# Patient Record
Sex: Female | Born: 1990 | Race: White | Hispanic: No | Marital: Single | Smoking: Never smoker
Health system: Southern US, Community
[De-identification: ages and names within clinical notes are randomized; demographics above are authoritative.]

## PROBLEM LIST (undated history)

## (undated) DIAGNOSIS — R5383 Other fatigue: Secondary | ICD-10-CM

## (undated) DIAGNOSIS — R5381 Other malaise: Secondary | ICD-10-CM

## (undated) DIAGNOSIS — R000 Tachycardia, unspecified: Secondary | ICD-10-CM

## (undated) DIAGNOSIS — T7840XA Allergy, unspecified, initial encounter: Secondary | ICD-10-CM

## (undated) DIAGNOSIS — K219 Gastro-esophageal reflux disease without esophagitis: Secondary | ICD-10-CM

## (undated) DIAGNOSIS — U071 COVID-19: Secondary | ICD-10-CM

## (undated) HISTORY — DX: Gastro-esophageal reflux disease without esophagitis: K21.9

## (undated) HISTORY — DX: Other fatigue: R53.83

## (undated) HISTORY — DX: Allergy, unspecified, initial encounter: T78.40XA

## (undated) HISTORY — DX: COVID-19: U07.1

## (undated) HISTORY — DX: Other malaise: R53.81

## (undated) HISTORY — DX: Tachycardia, unspecified: R00.0

---

## 2013-02-10 ENCOUNTER — Ambulatory Visit: Payer: BC Managed Care – PPO | Admitting: Family Medicine

## 2013-02-10 ENCOUNTER — Encounter: Payer: Self-pay | Admitting: Family Medicine

## 2013-02-10 VITALS — BP 98/68 | HR 74 | Temp 98.1°F | Resp 16 | Ht 62.0 in | Wt 102.0 lb

## 2013-02-10 DIAGNOSIS — G8929 Other chronic pain: Secondary | ICD-10-CM

## 2013-02-10 DIAGNOSIS — R1013 Epigastric pain: Secondary | ICD-10-CM

## 2013-02-10 LAB — POCT CBC
Granulocyte percent: 79.7 %G (ref 37–80)
HCT, POC: 43.3 % (ref 37.7–47.9)
Hemoglobin: 13.9 g/dL (ref 12.2–16.2)
Lymph, poc: 1.5 (ref 0.6–3.4)
MCH, POC: 30.1 pg (ref 27–31.2)
MCHC: 32.1 g/dL (ref 31.8–35.4)
MCV: 93.8 fL (ref 80–97)
MID (cbc): 0.4 (ref 0–0.9)
MPV: 10.3 fL (ref 0–99.8)
POC Granulocyte: 7.6 — AB (ref 2–6.9)
POC LYMPH PERCENT: 15.8 %L (ref 10–50)
POC MID %: 4.5 %M (ref 0–12)
Platelet Count, POC: 197 10*3/uL (ref 142–424)
RBC: 4.62 M/uL (ref 4.04–5.48)
RDW, POC: 13 %
WBC: 9.5 10*3/uL (ref 4.6–10.2)

## 2013-02-10 MED ORDER — LANSOPRAZOLE 15 MG PO TBDP: 15.0000 mg | ORAL_TABLET | Freq: Every day | ORAL | Status: DC

## 2013-02-10 NOTE — Progress Notes (Signed)
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  She has developed epigastric pain over the past two weeks, getting more and more constant, and associated with nausea and self-induced vomiting.  She initially had one episode of passing out.   Going to [employer].  She is not eating well.  Her mother cooks for her, but she doesn't eat the fruits and veggies, only Nutella.  LMP:  End of last month  Objective:  NAD, thin woman Chest:  Clear Heart:  Reg, no murmur Abdomen: soft, minimally tender epigastric, no HSM Results for orders placed in visit on 02/10/13  POCT CBC      Result Value Range   WBC 9.5  4.6 - 10.2 K/uL   Lymph, poc 1.5  0.6 - 3.4   POC LYMPH PERCENT 15.8  10 - 50 %L   MID (cbc) 0.4  0 - 0.9   POC MID % 4.5  0 - 12 %M   POC Granulocyte 7.6 (*) 2 - 6.9   Granulocyte percent 79.7  37 - 80 %G   RBC 4.62  4.04 - 5.48 M/uL   Hemoglobin 13.9  12.2 - 16.2 g/dL   HCT, POC 16.1  09.6 - 47.9 %   MCV 93.8  80 - 97 fL   MCH, POC 30.1  27 - 31.2 pg   MCHC 32.1  31.8 - 35.4 g/dL   RDW, POC 04.5     Platelet Count, POC 197  142 - 424 K/uL   MPV 10.3  0 - 99.8 fL     Assessment:  Gastritis, poor diet  Plan:  I emphasized the need for eating vegetables and fruits, 5 portions per day. Mother, who is present, promises to cook for her and patient promises to be her mom for food.

## 2013-02-10 NOTE — Patient Instructions (Addendum)

## 2013-02-12 ENCOUNTER — Ambulatory Visit: Payer: BC Managed Care – PPO | Admitting: Family Medicine

## 2013-02-12 VITALS — BP 98/70 | HR 106 | Temp 98.2°F | Resp 16 | Ht 61.5 in | Wt 102.0 lb

## 2013-02-12 DIAGNOSIS — F411 Generalized anxiety disorder: Secondary | ICD-10-CM

## 2013-02-12 NOTE — Progress Notes (Signed)
22 yo with recent epigastric discomfort, which has now resolved.  Nevertheless, she developed paresthesias in hands, forearms, then left side.  Today, developed some chest tightness.  Heart palpitations present also.  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 She's the left about her own worries and perfectionism.  Plan: I asked patient to begin exercise program, eat a regular diet as her mother fixes her.  No diagnosis found.  Signed, Elvina Sidle, MD

## 2013-02-14 LAB — HELICOBACTER PYLORI  ANTIBODY, IGM: Helicobacter pylori, IgM: 6.4 U/mL (ref <9.0)

## 2013-04-28 ENCOUNTER — Other Ambulatory Visit: Payer: Self-pay | Admitting: Family Medicine

## 2014-01-17 ENCOUNTER — Ambulatory Visit (INDEPENDENT_AMBULATORY_CARE_PROVIDER_SITE_OTHER): Payer: BC Managed Care – PPO | Admitting: Physician Assistant

## 2014-01-17 VITALS — BP 98/62 | HR 79 | Temp 98.3°F | Resp 18 | Ht 62.0 in | Wt 101.0 lb

## 2014-01-17 DIAGNOSIS — Z111 Encounter for screening for respiratory tuberculosis: Secondary | ICD-10-CM

## 2014-01-17 NOTE — Progress Notes (Signed)
  Tuberculosis Risk Questionnaire  1. Yes  Were you born outside the [place] in one of the following parts of the world: [place], [place], [place], [place] or Eastern [place]?    2. Yes  Have you traveled outside the [place] and lived for more than one month in one of the following parts of the world: [place], [place], [place], [place] or [place]?    3. No Do you have a compromised immune system such as from any of the following conditions:HIV/AIDS, organ or bone marrow transplantation, diabetes, immunosuppressive medicines (e.g. Prednisone, Remicaide), leukemia, lymphoma, cancer of the head or neck, gastrectomy or jejunal bypass, end-stage renal disease (on dialysis), or silicosis?     4. Yes  Have you ever or do you plan on working in: a residential care center, a health care facility, a jail or prison or homeless shelter?    5. No Have you ever: injected illegal drugs, used crack cocaine, lived in a homeless shelter  or been in jail or prison?     6. No Have you ever been exposed to anyone with infectious tuberculosis?    Tuberculosis Symptom Questionnaire  Do you currently have any of the following symptoms?  1. No Unexplained cough lasting more than 3 weeks?   2. No Unexplained fever lasting more than 3 weeks.   3. No Night Sweats (sweating that leaves the bedclothes and sheets wet)     4. No Shortness of Breath   5. No Chest Pain   6. No Unintentional weight loss    7. No Unexplained fatigue (very tired for no reason)   

## 2014-01-17 NOTE — Patient Instructions (Signed)
Return in 48-72 hours to have your TB test read (between 2:30pm and 2:30pm Saturday)  You can have your second step placed any time between May 3 - May 17)   Tuberculin Skin Test The PPD skin test is a method used to help with the diagnosis of a disease called tuberculosis (TB). HOW THE TEST IS DONE  The test site (usually the forearm) is cleansed. The PPD extract is then injected under the top layer of skin, causing a blister to form on the skin. The reaction will take 48 - 72 hours to develop. You must return to your health care provider within that time to have the area checked. This will determine whether you have had a significant reaction to the PPD test. A reaction is measured in millimeters of hard swelling (induration) at the site. PREPARATION FOR TEST  There is no special preparation for this test. People with a skin rash or other skin irritations on their arms may need to have the test performed at a different spot on the body. Tell your health care provider if you have ever had a positive PPD skin test. If so, you should not have a repeat PPD test. Tell your doctor if you have a medical condition or if you take certain drugs, such as steroids, that can affect your immune system. These situations may lead to inaccurate test results. NORMAL FINDINGS A negative reaction (no induration) or a level of hard swelling that falls below a certain cutoff may mean that a person has not been infected with the bacteria that cause TB. There are different cutoffs for children, people with HIV, and other risk groups. Unfortunately, this is not a perfect test, and up to 20% of people infected with tuberculosis may not have a reaction on the PPD skin test. In addition, certain conditions that affect the immune system (cancer, recent chemotherapy, late-stage AIDS) may cause a false-negative test result.  The reaction will take 48 - 72 hours to develop. You must return to your health care provider within  that time to have the area checked. Follow your caregiver's instructions as to where and when to report for this to be done. Ranges for normal findings may vary among different laboratories and hospitals. You should always check with your doctor after having lab work or other tests done to discuss the meaning of your test results and whether your values are considered within normal limits. WHAT ABNORMAL RESULTS MEAN  The results of the test depend on the size of the skin reaction and on the person being tested.  A small reaction (5 mm of hard swelling at the site) is considered to be positive in people who have HIV, who are taking steroid therapy, or who have been in close contact with a person who has active tuberculosis. Larger reactions (greater than or equal to 10 mm) are considered positive in people with diabetes or kidney failure, and in health care workers, among others. In people with no known risks for tuberculosis, a positive reaction requires 15 mm or more of hard swelling at the site. RISKS AND COMPLICATIONS There is a very small risk of severe redness and swelling of the arm in people who have had a previous positive PPD test and who have the test again. There also have been a few rare cases of this reaction in people who have not been tested before. CONSIDERATIONS  A positive skin test does not necessarily mean that a person has active tuberculosis. More tests  will be done to check whether active disease is present. Many people who were born outside the [place] may have had a vaccine called "BCG," which can lead to a false-positive test result. MEANING OF TEST  Your caregiver will go over the test results with you and discuss the importance and meaning of your results, as well as treatment options and the need for additional tests if necessary. OBTAINING THE TEST RESULTS It is your responsibility to obtain your test results. Ask the lab or department performing the test when and how  you will get your results. Document Released: 05/20/2005 Document Revised: 11/02/2011 Document Reviewed: 07/22/2008 [facility] Patient Information 2014 [place], [place].

## 2014-01-17 NOTE — Progress Notes (Signed)
   Subjective:    Patient ID: Amber Francis, female    DOB: [birth year], 23 y.o.   MRN: [id number]  HPI   Amber Francis is a very pleasant 23 yr old female here for PPD placement.  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  She has had TB testing in the past - all negative.  Last tested with quantiferon gold in 2014.  See TB questionnaire below.  She feels well today.    Review of Systems  Constitutional: Negative for fever and chills.  Respiratory: Negative.   Cardiovascular: Negative.   Gastrointestinal: Negative.        Objective:   Physical Exam  Vitals reviewed. Constitutional: She is oriented to person, place, and time. She appears well-developed and well-nourished. No distress.  HENT:  Head: Normocephalic and atraumatic.  Eyes: Conjunctivae are normal. No scleral icterus.  Cardiovascular: Normal rate, regular rhythm and normal heart sounds.   Pulmonary/Chest: Effort normal and breath sounds normal. She has no wheezes. She has no rales.  Neurological: She is alert and oriented to person, place, and time.  Skin: Skin is warm and dry.  Psychiatric: She has a normal mood and affect. Her behavior is normal.       Assessment & Plan:  Screening for tuberculosis - Plan: TB Skin Test   Amber Francis is a very pleasant 23 yr old female here for TB testing.  PPD placed.  RTC 48-72 hours for read.  Pt to return in 1-3 wks for placement of second step   [name] Furbish MHS, PA-C [facility] 5/28/20157:12 PM

## 2014-01-19 ENCOUNTER — Ambulatory Visit (INDEPENDENT_AMBULATORY_CARE_PROVIDER_SITE_OTHER): Payer: BC Managed Care – PPO | Admitting: Family Medicine

## 2014-01-19 DIAGNOSIS — Z111 Encounter for screening for respiratory tuberculosis: Secondary | ICD-10-CM

## 2014-01-19 DIAGNOSIS — Z09 Encounter for follow-up examination after completed treatment for conditions other than malignant neoplasm: Secondary | ICD-10-CM

## 2014-01-19 LAB — TB SKIN TEST
Induration: 0 mm
TB Skin Test: NEGATIVE

## 2014-01-26 ENCOUNTER — Ambulatory Visit (INDEPENDENT_AMBULATORY_CARE_PROVIDER_SITE_OTHER): Payer: BC Managed Care – PPO | Admitting: Family Medicine

## 2014-01-26 VITALS — HR 73 | Temp 97.4°F | Resp 16 | Ht 62.0 in | Wt 100.0 lb

## 2014-01-26 DIAGNOSIS — Z111 Encounter for screening for respiratory tuberculosis: Secondary | ICD-10-CM

## 2014-01-26 NOTE — Progress Notes (Unsigned)
   Subjective:    Patient ID: Amber Francis, female    DOB: 08-Feb-1991, 23 y.o.   MRN: [id number]  HPI    Review of Systems     Objective:   Physical Exam        Assessment & Plan:   Tuberculosis Risk Questionnaire  1. No Were you born outside the [place] in one of the following parts of the world: [place], [place], [place], [place] or [place]?    2. No Have you traveled outside the [place] and lived for more than one month in one of the following parts of the world: [place], [place], [place], [place] or [place]?    3. No Do you have a compromised immune system such as from any of the following conditions:HIV/AIDS, organ or bone marrow transplantation, diabetes, immunosuppressive medicines (e.g. Prednisone, Remicaide), leukemia, lymphoma, cancer of the head or neck, gastrectomy or jejunal bypass, end-stage renal disease (on dialysis), or silicosis?     4. Yes NURSING SCHOOL Have you ever or do you plan on working in: a residential care center, a health care facility, a jail or prison or homeless shelter?    5. No Have you ever: injected illegal drugs, used crack cocaine, lived in a homeless shelter  or been in jail or prison?     6. No Have you ever been exposed to anyone with infectious tuberculosis?    Tuberculosis Symptom Questionnaire  Do you currently have any of the following symptoms?  1. No Unexplained cough lasting more than 3 weeks?   2. No Unexplained fever lasting more than 3 weeks.   3. No Night Sweats (sweating that leaves the bedclothes and sheets wet)     4. No Shortness of Breath   5. No Chest Pain   6. No Unintentional weight loss    7. No Unexplained fatigue (very tired for no reason)

## 2014-01-28 ENCOUNTER — Ambulatory Visit (INDEPENDENT_AMBULATORY_CARE_PROVIDER_SITE_OTHER): Payer: BC Managed Care – PPO | Admitting: Family Medicine

## 2014-01-28 DIAGNOSIS — Z111 Encounter for screening for respiratory tuberculosis: Secondary | ICD-10-CM

## 2014-01-28 LAB — TB SKIN TEST
Induration: 0 mm
TB Skin Test: NEGATIVE

## 2014-08-15 ENCOUNTER — Other Ambulatory Visit: Payer: Self-pay | Admitting: Family Medicine

## 2014-12-04 ENCOUNTER — Ambulatory Visit (INDEPENDENT_AMBULATORY_CARE_PROVIDER_SITE_OTHER): Payer: BLUE CROSS/BLUE SHIELD | Admitting: Urgent Care

## 2014-12-04 VITALS — BP 108/60 | HR 76 | Temp 98.8°F | Resp 16 | Ht 62.0 in | Wt 104.0 lb

## 2014-12-04 DIAGNOSIS — R1011 Right upper quadrant pain: Secondary | ICD-10-CM | POA: Diagnosis not present

## 2014-12-04 DIAGNOSIS — K5900 Constipation, unspecified: Secondary | ICD-10-CM | POA: Diagnosis not present

## 2014-12-04 DIAGNOSIS — K219 Gastro-esophageal reflux disease without esophagitis: Secondary | ICD-10-CM

## 2014-12-04 DIAGNOSIS — R1013 Epigastric pain: Secondary | ICD-10-CM | POA: Diagnosis not present

## 2014-12-04 LAB — POCT CBC
Granulocyte percent: 55.3 %G (ref 37–80)
HCT, POC: 39.7 % (ref 37.7–47.9)
HEMOGLOBIN: 13 g/dL (ref 12.2–16.2)
LYMPH, POC: 3 (ref 0.6–3.4)
MCH: 29.5 pg (ref 27–31.2)
MCHC: 32.9 g/dL (ref 31.8–35.4)
MCV: 89.8 fL (ref 80–97)
MID (cbc): 0.6 (ref 0–0.9)
MPV: 8.4 fL (ref 0–99.8)
POC Granulocyte: 4.5 (ref 2–6.9)
POC LYMPH PERCENT: 37.1 %L (ref 10–50)
POC MID %: 7.6 % (ref 0–12)
Platelet Count, POC: 233 10*3/uL (ref 142–424)
RBC: 4.42 M/uL (ref 4.04–5.48)
RDW, POC: 12 %
WBC: 8.1 10*3/uL (ref 4.6–10.2)

## 2014-12-04 MED ORDER — DOCUSATE SODIUM 50 MG PO CAPS: 50.0000 mg | ORAL_CAPSULE | Freq: Two times a day (BID) | ORAL | Status: DC

## 2014-12-04 MED ORDER — RANITIDINE HCL 150 MG PO TABS: 150.0000 mg | ORAL_TABLET | Freq: Two times a day (BID) | ORAL | Status: DC

## 2014-12-04 MED ORDER — LANSOPRAZOLE 15 MG PO CPDR: 15.0000 mg | DELAYED_RELEASE_CAPSULE | Freq: Every day | ORAL | Status: DC

## 2014-12-04 NOTE — Patient Instructions (Signed)
Food Choices for Gastroesophageal Reflux Disease When you have gastroesophageal reflux disease (GERD), the foods you eat and your eating habits are very important. Choosing the right foods can help ease the discomfort of GERD. WHAT GENERAL GUIDELINES DO I NEED TO FOLLOW?  Choose fruits, vegetables, whole grains, low-fat dairy products, and low-fat meat, fish, and poultry.  Limit fats such as oils, salad dressings, butter, nuts, and avocado.  Keep a food diary to identify foods that cause symptoms.  Avoid foods that cause reflux. These may be different for different people.  Eat frequent small meals instead of three large meals each day.  Eat your meals slowly, in a relaxed setting.  Limit fried foods.  Cook foods using methods other than frying.  Avoid drinking alcohol.  Avoid drinking large amounts of liquids with your meals.  Avoid bending over or lying down until 2-3 hours after eating. WHAT FOODS ARE NOT RECOMMENDED? The following are some foods and drinks that may worsen your symptoms: Vegetables Tomatoes. Tomato juice. Tomato and spaghetti sauce. Chili peppers. Onion and garlic. Horseradish. Fruits Oranges, grapefruit, and lemon (fruit and juice). Meats High-fat meats, fish, and poultry. This includes hot dogs, ribs, ham, sausage, salami, and bacon. Dairy Whole milk and chocolate milk. Sour cream. Cream. Butter. Ice cream. Cream cheese.  Beverages Coffee and tea, with or without caffeine. Carbonated beverages or energy drinks. Condiments Hot sauce. Barbecue sauce.  Sweets/Desserts Chocolate and cocoa. Donuts. Peppermint and spearmint. Fats and Oils High-fat foods, including [place] fries and potato chips. Other Vinegar. Strong spices, such as black pepper, white pepper, red pepper, cayenne, curry powder, cloves, ginger, and chili powder. The items listed above may not be a complete list of foods and beverages to avoid. Contact your dietitian for more  information. Document Released: 08/10/2005 Document Revised: 08/15/2013 Document Reviewed: 06/14/2013 [facility] Patient Information 2015 [place]. This information is not intended to replace advice given to you by your health care provider. Make sure you discuss any questions you have with your health care provider.    Gastroesophageal Reflux Disease, Adult Gastroesophageal reflux disease (GERD) happens when acid from your stomach flows up into the esophagus. When acid comes in contact with the esophagus, the acid causes soreness (inflammation) in the esophagus. Over time, GERD may create small holes (ulcers) in the lining of the esophagus. CAUSES   Increased body weight. This puts pressure on the stomach, making acid rise from the stomach into the esophagus.  Smoking. This increases acid production in the stomach.  Drinking alcohol. This causes decreased pressure in the lower esophageal sphincter (valve or ring of muscle between the esophagus and stomach), allowing acid from the stomach into the esophagus.  Late evening meals and a full stomach. This increases pressure and acid production in the stomach.  A malformed lower esophageal sphincter. Sometimes, no cause is found. SYMPTOMS   Burning pain in the lower part of the mid-chest behind the breastbone and in the mid-stomach area. This may occur twice a week or more often.  Trouble swallowing.  Sore throat.  Dry cough.  Asthma-like symptoms including chest tightness, shortness of breath, or wheezing. DIAGNOSIS  Your caregiver may be able to diagnose GERD based on your symptoms. In some cases, X-rays and other tests may be done to check for complications or to check the condition of your stomach and esophagus. TREATMENT  Your caregiver may recommend over-the-counter or prescription medicines to help decrease acid production. Ask your caregiver before starting or adding any new medicines.  INSTRUCTIONS  °· Change the  factors that you can control. Ask your caregiver for guidance concerning weight loss, quitting smoking, and alcohol consumption. °· Avoid foods and drinks that make your symptoms worse, such as: °· Caffeine or alcoholic drinks. °· Chocolate. °· Peppermint or mint flavorings. °· Garlic and onions. °· Spicy foods. °· Citrus fruits, such as oranges, lemons, or limes. °· Tomato-based foods such as sauce, chili, salsa, and pizza. °· Fried and fatty foods. °· Avoid lying down for the 3 hours prior to your bedtime or prior to taking a nap. °· Eat small, frequent meals instead of large meals. °· Wear loose-fitting clothing. Do not wear anything tight around your waist that causes pressure on your stomach. °· Raise the head of your bed 6 to 8 inches with wood blocks to help you sleep. Extra pillows will not help. °· Only take over-the-counter or prescription medicines for pain, discomfort, or fever as directed by your caregiver. °· Do not take aspirin, ibuprofen, or other nonsteroidal anti-inflammatory drugs (NSAIDs). °SEEK IMMEDIATE MEDICAL CARE IF:  °· You have pain in your arms, neck, jaw, teeth, or back. °· Your pain increases or changes in intensity or duration. °· You develop nausea, vomiting, or sweating (diaphoresis). °· You develop shortness of breath, or you faint. °· Your vomit is green, yellow, black, or looks like coffee grounds or blood. °· Your stool is red, bloody, or black. °These symptoms could be signs of other problems, such as heart disease, gastric bleeding, or esophageal bleeding. °MAKE SURE YOU:  °· Understand these instructions. °· Will watch your condition. °· Will get help right away if you are not doing well or get worse. °Document Released: 05/20/2005 Document Revised: 11/02/2011 Document Reviewed: 02/27/2011 °ExitCare® Patient Information ©2015 [employer] LLC. This information is not intended to replace advice given to you by your health care provider. Make sure you discuss any questions you have  with your health care provider. °Constipation °Constipation is when a person has fewer than three bowel movements a week, has difficulty having a bowel movement, or has stools that are dry, hard, or larger than normal. As people grow older, constipation is more common. If you try to fix constipation with medicines that make you have a bowel movement (laxatives), the problem may get worse. Long-term laxative use may cause the muscles of the colon to become weak. A low-fiber diet, not taking in enough fluids, and taking certain medicines may make constipation worse.  °CAUSES  °· Certain medicines, such as antidepressants, pain medicine, iron supplements, antacids, and water pills.   °· Certain diseases, such as diabetes, irritable bowel syndrome (IBS), thyroid disease, or depression.   °· Not drinking enough water.   °· Not eating enough fiber-rich foods.   °· Stress or travel.   °· Lack of physical activity or exercise.   °· Ignoring the urge to have a bowel movement.   °· Using laxatives too much.   °SIGNS AND SYMPTOMS  °· Having fewer than three bowel movements a week.   °· Straining to have a bowel movement.   °· Having stools that are hard, dry, or larger than normal.   °· Feeling full or bloated.   °· Pain in the lower abdomen.   °· Not feeling relief after having a bowel movement.   °DIAGNOSIS  °Your health care provider will take a medical history and perform a physical exam. Further testing may be done for severe constipation. Some tests may include: °· A barium enema X-ray to examine your rectum, colon, and, sometimes, your small intestine.   °· A   sigmoidoscopy to examine your lower colon.   °· A colonoscopy to examine your entire colon. °TREATMENT  °Treatment will depend on the severity of your constipation and what is causing it. Some dietary treatments include drinking more fluids and eating more fiber-rich foods. Lifestyle treatments may include regular exercise. If these diet and lifestyle  recommendations do not help, your health care provider may recommend taking over-the-counter laxative medicines to help you have bowel movements. Prescription medicines may be prescribed if over-the-counter medicines do not work.  °HOME CARE INSTRUCTIONS  °· Eat foods that have a lot of fiber, such as fruits, vegetables, whole grains, and beans. °· Limit foods high in fat and processed sugars, such as french fries, hamburgers, cookies, candies, and soda.   °· A fiber supplement may be added to your diet if you cannot get enough fiber from foods.   °· Drink enough fluids to keep your urine clear or pale yellow.   °· Exercise regularly or as directed by your health care provider.   °· Go to the restroom when you have the urge to go. Do not hold it.   °· Only take over-the-counter or prescription medicines as directed by your health care provider. Do not take other medicines for constipation without talking to your health care provider first.   °SEEK IMMEDIATE MEDICAL CARE IF:  °· You have bright red blood in your stool.   °· Your constipation lasts for more than 4 days or gets worse.   °· You have abdominal or rectal pain.   °· You have thin, pencil-like stools.   °· You have unexplained weight loss. °MAKE SURE YOU:  °· Understand these instructions. °· Will watch your condition. °· Will get help right away if you are not doing well or get worse. °Document Released: 05/08/2004 Document Revised: 08/15/2013 Document Reviewed: 05/22/2013 °ExitCare® Patient Information ©2015 [employer]. This information is not intended to replace advice given to you by your health care provider. Make sure you discuss any questions you have with your health care provider. ° °

## 2014-12-04 NOTE — Progress Notes (Signed)
MRN: [id number] DOB: [birth year]  Subjective:   Amber Francis is a 24 y.o. female presenting for chief complaint of Abdominal Pain and Medication Refill  Reports ~2 year history of epigastric pain, intermittently radiates to RUQ and right shoulder, sometimes aggravated by stress and food, chocolate specifically since she says she tries to avoid tomato based foods, spicy foods and heavy dairy foods. Has tried lansoprazole with some relief, previous H. Pylori blood work was negative in 2014, had U/S of liver ~1.5 years ago per patient which was negative. Also admits intermittent constipation, hard stools, straining, diet does not include a lot of fiber. Denies fevers, nausea, vomiting, diarrhea, bloody stool, chest pain, shob, cough, mouth lesions. Denies regular use of NSAIDs. States that her dad and other family members also have reflux and PUD. Denies history of Crohn or UC, family history of colon cancer. Denies smoking or alcohol use. Denies any other aggravating or relieving factors, no other questions or concerns.  Amber Francis has a current medication list which includes the following prescription(s): cetirizine, lansoprazole, and probiotic product. She has No Known Allergies.  Amber Francis  has a past medical history of Allergy. Also  has no past surgical history on file.  ROS As in subjective.  Objective:   Vitals: BP 108/60 mmHg  Pulse 76  Temp(Src) 98.8 F (37.1 C) (Oral)  Resp 16  Ht  (1.575 m)  Wt 104 lb (47.174 kg)  BMI 19.02 kg/m2  SpO2 100%  LMP 11/26/2014  Physical Exam  Constitutional: She is well-developed, well-nourished, and in no distress.  HENT:  Mouth/Throat: Oropharynx is clear and moist. No oropharyngeal exudate.  Eyes: Conjunctivae are normal. Right eye exhibits no discharge. Left eye exhibits no discharge. No scleral icterus.  Cardiovascular: Normal rate, regular rhythm and intact distal pulses.  Exam reveals no gallop and no friction rub.   No murmur  heard. Pulmonary/Chest: No respiratory distress. She has no wheezes. She has no rales. She exhibits no tenderness.  Abdominal: Soft. Bowel sounds are normal. She exhibits no distension and no mass. There is no tenderness. There is no rebound and no guarding.  Skin: Skin is warm and dry. No rash noted. No erythema. No pallor.   Results for orders placed or performed in visit on 12/04/14 (from the past 24 hour(s))  POCT CBC     Status: None   Collection Time: 12/04/14  7:50 PM  Result Value Ref Range   WBC 8.1 4.6 - 10.2 K/uL   Lymph, poc 3.0 0.6 - 3.4   POC LYMPH PERCENT 37.1 10 - 50 %L   MID (cbc) 0.6 0 - 0.9   POC MID % 7.6 0 - 12 %M   POC Granulocyte 4.5 2 - 6.9   Granulocyte percent 55.3 37 - 80 %G   RBC 4.42 4.04 - 5.48 M/uL   Hemoglobin 13.0 12.2 - 16.2 g/dL   HCT, POC 75.6 43.3 - 47.9 %   MCV 89.8 80 - 97 fL   MCH, POC 29.5 27 - 31.2 pg   MCHC 32.9 31.8 - 35.4 g/dL   RDW, POC 29.5 %   Platelet Count, POC 233 142 - 424 K/uL   MPV 8.4 0 - 99.8 fL   Assessment and Plan :   1. RUQ pain 2. Abdominal pain, epigastric 3. Gastroesophageal reflux disease, esophagitis presence not specified - Likely uncontrolled GERD, consider PUD, H. Pylori infection, will restart reflux medication, also advised ranitidine as needed. Review potential for adverse effects, patient  acknowledged and agreed to trial of PPI and H-2 blocker as needed. - Referral to GI for endoscopy.  4. Constipation, unspecified constipation type - Increase fiber and water intake, continue exercise - Start docusate and decrease use gradually - Return to clinic if no improvement in symptoms despite conservative management  Wallis BambergMario Ayodeji Keimig, PA-C [facility] [phone] 12/04/2014 7:03 PM

## 2014-12-05 LAB — COMPREHENSIVE METABOLIC PANEL
ALBUMIN: 4.8 g/dL (ref 3.5–5.2)
ALT: 20 U/L (ref 0–35)
AST: 16 U/L (ref 0–37)
Alkaline Phosphatase: 55 U/L (ref 39–117)
BUN: 9 mg/dL (ref 6–23)
CHLORIDE: 104 meq/L (ref 96–112)
CO2: 25 mEq/L (ref 19–32)
Calcium: 9.8 mg/dL (ref 8.4–10.5)
Creat: 0.57 mg/dL (ref 0.50–1.10)
GLUCOSE: 70 mg/dL (ref 70–99)
Potassium: 4.5 mEq/L (ref 3.5–5.3)
Sodium: 141 mEq/L (ref 135–145)
TOTAL PROTEIN: 7.5 g/dL (ref 6.0–8.3)
Total Bilirubin: 0.3 mg/dL (ref 0.2–1.2)

## 2014-12-10 ENCOUNTER — Encounter: Payer: Self-pay | Admitting: Internal Medicine

## 2014-12-27 ENCOUNTER — Ambulatory Visit (INDEPENDENT_AMBULATORY_CARE_PROVIDER_SITE_OTHER): Payer: BLUE CROSS/BLUE SHIELD | Admitting: Internal Medicine

## 2014-12-27 ENCOUNTER — Encounter: Payer: Self-pay | Admitting: Internal Medicine

## 2014-12-27 VITALS — BP 100/58 | HR 52 | Ht 62.0 in | Wt 105.0 lb

## 2014-12-27 DIAGNOSIS — K5900 Constipation, unspecified: Secondary | ICD-10-CM | POA: Diagnosis not present

## 2014-12-27 DIAGNOSIS — R1013 Epigastric pain: Secondary | ICD-10-CM | POA: Diagnosis not present

## 2014-12-27 MED ORDER — FAMOTIDINE-CA CARB-MAG HYDROX 10-800-165 MG PO CHEW: 1.0000 | CHEWABLE_TABLET | Freq: Two times a day (BID) | ORAL | Status: DC | PRN

## 2014-12-27 NOTE — Progress Notes (Signed)
Subjective:    Patient ID: Madalyn RobJasmina Schnabel, female    DOB: [birth year], 24 y.o.   MRN: [id number] Cc: epigastric pain HPI The patient is a very pleasant 24 year old woman of [place] heritage who has epigastric pain that is intermittent. 2 years ago when taking anatomy and physiology she had some early satiety problems and anorexia. Check sleep passed out one day because she didn't eat well. She was seen at urgent care told to take lansoprazole which she did but after 30 days stopped it. It helped her epigastric pain symptoms significantly. The pain is somewhat sharp at times radiates around to the right upper quadrant. She has had an ultrasound ordered through a primary care provider [place] that was unremarkable she says. She has been tested for H. pylori serology which is negative, CBC is normal LFTs are normal. She has intermittent problems now triggered by spicy foods or chocolate or citrus or tomato-based foods. She does not have classic heartburn but she gets is epigastric pain. She will then take lansoprazole for a few days and it will calm down and go away. She can go for a long time without symptoms. Flaring a little bit now. 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  Also struggles with some constipation. She is not eating much fiber. Stool softener was tried but didn't seem to help much. She is not on any supplements. She has been tested for food allergies of the integrative medicine they told her she had a yeast sensitivities she is wondering about that. She is also gone gluten-free. She has not had celiac testing that I am aware of. He has lost weight in the past but is gaining and feels like she is steady at this time. Mom is with her today.  No Known Allergies Outpatient Prescriptions Prior to Visit  Medication Sig Dispense Refill  . cetirizine (ZYRTEC) 10 MG tablet Take 10 mg by mouth as needed.     . Probiotic  Product (PROBIOTIC DAILY PO) Take by mouth daily.     Marland Kitchen. docusate sodium (COLACE) 50 MG capsule Take 1 capsule (50 mg total) by mouth 2 (two) times daily. (Patient taking differently: Take 50 mg by mouth as needed. ) 60 capsule 3  . lansoprazole (PREVACID) 15 MG capsule Take 1 capsule (15 mg total) by mouth daily. (Patient taking differently: Take 15 mg by mouth as needed. ) 90 capsule 3  . ranitidine (ZANTAC) 150 MG tablet Take 1 tablet (150 mg total) by mouth 2 (two) times daily. (Patient taking differently: Take 150 mg by mouth as needed. ) 60 tablet 3   No facility-administered medications prior to visit.   Past Medical History  Diagnosis Date  . Allergy   . GERD (gastroesophageal reflux disease)    History reviewed. No pertinent past surgical history. History   Social History  . Marital Status: Single    Spouse Name: N/A  . Number of Children: N/A  . Years of Education: N/A   Occupational History  . Full time student    Social History Main Topics  . Smoking status: Never Smoker   . Smokeless tobacco: Not on file  . Alcohol Use: No  . Drug Use: No  . Sexual Activity: No   Other Topics Concern  . 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 No children. No caffeine.  12/27/2014      Family History  Problem Relation Age of Onset  . Arthritis Mother   . Diabetes      Grandparents  . Diabetes Mother    Review of Systems As per history of present illness. She has some allergy symptoms as well. All other review of systems are negative.    Objective:   Physical Exam @BP  100/58 mmHg  Pulse 52  Ht 5\' 2"  (1.575 m)  Wt 105 lb (47.628 kg)  BMI 19.20 kg/m2  LMP 11/26/2014@  General:  Well-developed, well-nourished and in no acute distress Eyes:  anicteric. ENT:   Mouth and posterior pharynx free of lesions.  Neck:   supple w/o thyromegaly or mass.  Lungs: Clear to auscultation bilaterally. Heart:  S1S2, no rubs, murmurs,  gallops. Abdomen:  soft, non-tender, no hepatosplenomegaly, hernia, or mass and BS+.  Lymph:  no cervical or supraclavicular adenopathy. Extremities:   no edema, cyanosis or clubbing Skin   no rash. Neuro:  A&O x 3.  Psych:  appropriate mood and  Affect.   Data Reviewed: As per history of present illness Wt Readings from Last 3 Encounters:  12/27/14 105 lb (47.628 kg)  12/04/14 104 lb (47.174 kg)  01/26/14 100 lb (45.36 kg)      Assessment & Plan:   1. Dyspepsia - GERD like   2. Constipation, unspecified constipation type   1.  she will take Pepcid Complete as needed as prophylactic and rescue therapy. 2. If this fails to work she will add back lansoprazole intermittently. 3. Reassured today on think any further investigation is needed I think she has GERD-like dyspepsia. 4. For constipation she is trying to increase fluids, I've advised her to add fiber to her diet and if that fails to try Benefiber. MiraLAX will be the next step perhaps. 5. I don't think she has yeast problems, it's unclear to me what those tests mean and they're often unreliable i.e. serologic food allergy testing I've also advised her that she does not need to stay on a gluten-free diet 6. She will see me as needed  I appreciate the opportunity to care for her. EA:VWUJ,WJXBJCc:Mani,Mario, PA-C

## 2014-12-27 NOTE — Patient Instructions (Signed)
Today you have been given handouts to read and follow on High Fiber diet and GERD.  If the high fiber diet doesn't help use the benefiber, handout provided.   Use pepcid complete as needed for indigestion.  If that doesn't help you can go back to the lansoprazole.   Follow up with Dr.Gessner as needed.    I appreciate the opportunity to care for you.

## 2014-12-29 DIAGNOSIS — R1013 Epigastric pain: Secondary | ICD-10-CM | POA: Insufficient documentation

## 2014-12-29 DIAGNOSIS — K5900 Constipation, unspecified: Secondary | ICD-10-CM | POA: Insufficient documentation

## 2015-01-17 ENCOUNTER — Ambulatory Visit (INDEPENDENT_AMBULATORY_CARE_PROVIDER_SITE_OTHER): Payer: BLUE CROSS/BLUE SHIELD | Admitting: Family Medicine

## 2015-01-17 VITALS — BP 104/54 | HR 73 | Temp 98.3°F | Resp 14 | Ht 62.0 in | Wt 105.8 lb

## 2015-01-17 DIAGNOSIS — Z111 Encounter for screening for respiratory tuberculosis: Secondary | ICD-10-CM | POA: Diagnosis not present

## 2015-01-17 MED ORDER — TUBERCULIN PPD 5 UNIT/0.1ML ID SOLN
5.0000 [IU] | Freq: Once | INTRADERMAL | Status: AC
  Administered 2015-01-17: 5 [IU] via INTRADERMAL

## 2015-01-17 NOTE — Patient Instructions (Signed)
Return in 48-72 hours to have the PPD read.   That means you need to come in Sunday.

## 2015-01-17 NOTE — Progress Notes (Signed)
   Subjective:    Patient ID: Amber Francis, female    DOB: 08-07-1991, 24 y.o.   MRN: [id number]  HPI    Review of Systems     Objective:   Physical Exam        Assessment & Plan:

## 2015-01-17 NOTE — Progress Notes (Signed)
  Tuberculosis Risk Questionnaire  1. no  Were you born outside the [place] in one of the following parts of the world: [place], [place], [place], [place] or [place]?    2. No  Have you traveled outside the [place] and lived for more than one month in one of the following parts of the world: [place], [place], [place], [place] or [place]?    3. No   Do you have a compromised immune system such as from any of the following conditions:HIV/AIDS, organ or bone marrow transplantation, diabetes, immunosuppressive medicines (e.g. Prednisone, Remicaide), leukemia, lymphoma, cancer of the head or neck, gastrectomy or jejunal bypass, end-stage renal disease (on dialysis), or silicosis?     4. Yes   Have you ever or do you plan on working in: a residential care center, a health care facility, a jail or prison or homeless shelter?    5. No  Have you ever: injected illegal drugs, used crack cocaine, lived in a homeless shelter  or been in jail or prison?     6. No   Have you ever been exposed to anyone with infectious tuberculosis?    Tuberculosis Symptom Questionnaire  Do you currently have any of the following symptoms?  1. No  Unexplained cough lasting more than 3 weeks?   2. No  Unexplained fever lasting more than 3 weeks.   3. No   Night Sweats (sweating that leaves the bedclothes and sheets wet)     4. No   Shortness of Breath   5. No  Chest Pain   6. No   Unintentional weight loss    7. No  Unexplained fatigue (very tired for no reason)

## 2015-01-17 NOTE — Progress Notes (Signed)
  Subjective:  Patient ID: Amber Francis, female    DOB: 1991/01/01  Age: 24 y.o. 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   Objective:   Not examined. Has no history of exposure.  Assessment & Plan:   Assessment: TB immunity testing  Plan: Patient Instructions  Return in 48-72 hours to have the PPD read.   That means you need to come in Sunday.     HOPPER,DAVID, MD 01/17/2015

## 2015-01-20 ENCOUNTER — Ambulatory Visit (INDEPENDENT_AMBULATORY_CARE_PROVIDER_SITE_OTHER): Payer: BLUE CROSS/BLUE SHIELD

## 2015-01-20 ENCOUNTER — Encounter: Payer: Self-pay | Admitting: *Deleted

## 2015-01-20 DIAGNOSIS — Z111 Encounter for screening for respiratory tuberculosis: Secondary | ICD-10-CM

## 2015-01-20 DIAGNOSIS — Z7689 Persons encountering health services in other specified circumstances: Secondary | ICD-10-CM

## 2015-09-28 ENCOUNTER — Ambulatory Visit (INDEPENDENT_AMBULATORY_CARE_PROVIDER_SITE_OTHER): Payer: BLUE CROSS/BLUE SHIELD | Admitting: Physician Assistant

## 2015-09-28 VITALS — BP 96/66 | HR 79 | Temp 98.2°F | Resp 16 | Ht 62.25 in | Wt 106.2 lb

## 2015-09-28 DIAGNOSIS — H938X3 Other specified disorders of ear, bilateral: Secondary | ICD-10-CM | POA: Diagnosis not present

## 2015-09-28 MED ORDER — AMOXICILLIN 875 MG PO TABS: 875.0000 mg | ORAL_TABLET | Freq: Two times a day (BID) | ORAL | Status: DC

## 2015-09-28 NOTE — Patient Instructions (Signed)
Try Zyrtec-D 5-120 every 12 hours for 5 days.  Ask the pharmacist about this medication.  If this does not work then try the abx for ten days.  If this fails I will need to send you to a specialist.

## 2015-09-28 NOTE — Progress Notes (Signed)
09/28/2015 2:56 PM   DOB: [birth year] / MRN: [id number]  SUBJECTIVE:  Amber Francis is a 25 y.o. female presenting for bilateral ear pressure.  Reports this is somewhat chronic and admits to listening to her music too loudly.  Reports that she feels that she is often asking "what did you say" when taking with others because she can't hear well.  Denies any ear pain, dizziness, and tinnitus.    She has No Known Allergies.   She  has a past medical history of Allergy and GERD (gastroesophageal reflux disease).    She  reports that she has never smoked. She has never used smokeless tobacco. She reports that she does not drink alcohol or use illicit drugs. She  reports that she does not engage in sexual activity. The patient  has no past surgical history on file.  Her family history includes Arthritis in her mother; Diabetes in her mother.  Review of Systems  HENT: Positive for congestion and ear pain. Negative for ear discharge, sore throat and tinnitus.   Gastrointestinal: Negative for nausea.  Musculoskeletal: Negative for myalgias.  Skin: Negative for rash.  Neurological: Negative for headaches.    Problem list and medications reviewed and updated by myself where necessary, and exist elsewhere in the encounter.   OBJECTIVE:  BP 96/66 mmHg  Pulse 79  Temp(Src) 98.2 F (36.8 C) (Oral)  Resp 16  Ht 5' 2.25" (1.581 m)  Wt 106 lb 4 oz (48.195 kg)  BMI 19.28 kg/m2  SpO2 98%  LMP 09/17/2015 (Approximate)  Physical Exam  Constitutional: She is oriented to person, place, and time. She appears well-developed.  HENT:  Right Ear: Tympanic membrane, external ear and ear canal normal.  Left Ear: External ear and ear canal normal. A middle ear effusion (mild) is present.  Nose: Mucosal edema present. Right sinus exhibits no maxillary sinus tenderness and no frontal sinus tenderness. Left sinus exhibits no maxillary sinus tenderness and no frontal sinus tenderness.  Mouth/Throat: Uvula is  midline and oropharynx is clear and moist. No oropharyngeal exudate.  A>B bilaterally and no lateralization on Weber.  Hearing intact to whisper test at 8 feet.    Eyes: Conjunctivae and EOM are normal. Pupils are equal, round, and reactive to light.  Cardiovascular: Normal rate, regular rhythm and normal heart sounds.   Pulmonary/Chest: Effort normal and breath sounds normal. She has no rales.  Abdominal: She exhibits no distension.  Musculoskeletal: Normal range of motion.  Neurological: She is alert and oriented to person, place, and time. No cranial nerve deficit.  Skin: Skin is warm and dry. No rash noted. She is not diaphoretic. No erythema.  Psychiatric: She has a normal mood and affect. Her behavior is normal.  Vitals reviewed.   No results found for this or any previous visit (from the past 72 hour(s)).  No results found.   ASSESSMENT AND PLAN  Corona was seen today for bilateral ear pressure.  Diagnoses and all orders for this visit:  Ear pressure, bilateral She has a mild middle ear effusion on the left side.  Advised that she try pseudoephedrine for 5 days and if no improvement then try abx.  If this fails will send her to ENT for an evaluation.   -     amoxicillin (AMOXIL) 875 MG tablet; Take 1 tablet (875 mg total) by mouth 2 (two) times daily. Do not fill until 10/03/15.    The patient was advised to call or return to clinic if she  does not see an improvement in symptoms or to seek the care of the closest emergency department if she worsens with the above plan.   Deliah Boston, MHS, PA-C [facility] 09/28/2015 2:56 PM

## 2017-08-20 ENCOUNTER — Ambulatory Visit (INDEPENDENT_AMBULATORY_CARE_PROVIDER_SITE_OTHER): Payer: 59 | Admitting: Physician Assistant

## 2017-08-20 ENCOUNTER — Other Ambulatory Visit: Payer: Self-pay

## 2017-08-20 ENCOUNTER — Ambulatory Visit (INDEPENDENT_AMBULATORY_CARE_PROVIDER_SITE_OTHER): Payer: 59

## 2017-08-20 ENCOUNTER — Encounter: Payer: Self-pay | Admitting: Physician Assistant

## 2017-08-20 VITALS — BP 108/68 | HR 72 | Temp 98.0°F | Resp 16 | Ht 62.0 in | Wt 112.0 lb

## 2017-08-20 DIAGNOSIS — M79671 Pain in right foot: Secondary | ICD-10-CM

## 2017-08-20 DIAGNOSIS — M25571 Pain in right ankle and joints of right foot: Secondary | ICD-10-CM | POA: Diagnosis not present

## 2017-08-20 DIAGNOSIS — G8929 Other chronic pain: Secondary | ICD-10-CM | POA: Diagnosis not present

## 2017-08-20 MED ORDER — MELOXICAM 7.5 MG PO TABS
7.5000 mg | ORAL_TABLET | Freq: Every day | ORAL | 0 refills | Status: DC
Start: 2017-08-20 — End: 2020-11-14

## 2017-08-20 NOTE — Progress Notes (Signed)
[facility] [address], [place] [phone]  Date:  08/20/2017   Name:  Amber RobJasmina Francis   DOB:  October 20, 1990   MRN:  [id number]  PCP:  Wallis BambergMani, Mario, PA-C    History of Present Illness:  Amber Francis is a 26 y.o. female patient who presents to PCP with  Chief Complaint  Patient presents with  . Foot Injury     pt states pain comes and goes/ x 2 days/ right     Patient reports over 1 year of foot and ankle pain, but progressively worsening over the last 2 days.  She noted that she was dancing in her room, and following this, she had developed pain at the inner part of her heel.  She had noted no swelling.  She reports that she has foot pain at different times this can be at the top of her foot, ankle, and heel.  She has no joint pain anywhere else.  She has not done anything for the pain.  She notes that she was dancing last night, when she felt the pain.  She has no history of trauma to her right foot.   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  She generally wears brooks.  She will wear 2 different pairs that she interchanges.  These are about 24 years old.  She has no pain in the morning, but onset occurs after a long day.     Patient Active Problem List   Diagnosis Date Noted  . Dyspepsia - GERD like 12/29/2014  . CN (constipation) 12/29/2014    Past Medical History:  Diagnosis Date  . Allergy   . GERD (gastroesophageal reflux disease)     History reviewed. No pertinent surgical history.  Social History   Tobacco Use  . Smoking status: Never Smoker  . Smokeless tobacco: Never Used  Substance Use Topics  . Alcohol use: No    Alcohol/week: 0.0 oz  . Drug use: No    Family History  Problem Relation Age of Onset  . Arthritis Mother   . Diabetes Unknown        Grandparents  . Diabetes Mother     No Known Allergies  Medication list has been reviewed and updated.  Current Outpatient Medications on File Prior to Visit  Medication Sig  Dispense Refill  . cetirizine (ZYRTEC) 10 MG tablet Take 10 mg by mouth as needed.     Marland Kitchen. amoxicillin (AMOXIL) 875 MG tablet Take 1 tablet (875 mg total) by mouth 2 (two) times daily. Do not fill until 10/03/15. (Patient not taking: Reported on 08/20/2017) 20 tablet 0  . Probiotic Product (PROBIOTIC DAILY PO) Take by mouth daily.      No current facility-administered medications on file prior to visit.     ROS ROS otherwise unremarkable unless listed above.  Physical Examination: BP 108/68   Pulse 72   Temp 98 F (36.7 C) (Oral)   Resp 16   Ht 5\' 2"  (1.575 m)   Wt 112 lb (50.8 kg)   LMP 08/10/2017   SpO2 99%   BMI 20.49 kg/m  Ideal Body Weight: Weight in (lb) to have BMI = 25: 136.4  Physical Exam  Constitutional: She is oriented to person, place, and time. She appears well-developed and well-nourished. No distress.  HENT:  Head: Normocephalic and atraumatic.  Right Ear: External ear normal.  Left Ear: External ear normal.  Eyes: Conjunctivae and EOM are normal. Pupils are equal,  round, and reactive to light.  Cardiovascular: Normal rate.  Pulmonary/Chest: Effort normal. No respiratory distress.  Musculoskeletal:       Right ankle: She exhibits normal range of motion and no swelling. No lateral malleolus and no medial malleolus tenderness found. Achilles tendon normal. Achilles tendon exhibits no pain and normal Thompson's test results.  Medial area of the heel is tender however no crepitus or tenderness at the achilles tendon, or clalcification detected.  No pain inferiorly as well at the platnar aspect.   nromal gait.   5th mtp with slight erythema No tenderness at the tarsal area or ankle bones.  Neurological: She is alert and oriented to person, place, and time.  Skin: She is not diaphoretic.  Psychiatric: She has a normal mood and affect. Her behavior is normal.    Dg Ankle Complete Right  Result Date: 08/20/2017 CLINICAL DATA:  Right ankle pain. Chronic symptoms  worsening with ambulation. No reported acute injury. EXAM: RIGHT ANKLE - COMPLETE 3+ VIEW COMPARISON:  None. FINDINGS: The mineralization and alignment are normal. There is no evidence of acute fracture or dislocation. The joint spaces are maintained. No focal soft tissue abnormalities identified. IMPRESSION: Negative right ankle radiographs. Electronically Signed   By: Carey BullocksWilliam  Veazey M.D.   On: 08/20/2017 10:26   Dg Foot Complete Right  Result Date: 08/20/2017 CLINICAL DATA:  Right foot pain. Tender along the medial calcaneus. Pain with eversion. EXAM: RIGHT FOOT COMPLETE - 3+ VIEW COMPARISON:  None. FINDINGS: There is no evidence of fracture or dislocation. There is no evidence of arthropathy or other focal bone abnormality. Soft tissues are unremarkable. IMPRESSION: Negative right foot radiographs. Electronically Signed   By: Marin Robertshristopher  Mattern M.D.   On: 08/20/2017 10:25     Assessment and Plan: Amber Francis is a 26 y.o. female who is here today for cc of  Chief Complaint  Patient presents with  . Foot Injury     pt states pain comes and goes/ x 2 days/ right   Possibly ill fitting shoes.  Advised cushion and shock absorbing.  Given NSAID and precautions placed on the use.  Also advised ice baths.  This has been more chronic and I have offered podiatry consult.  She would like this.  Ordering today. Chronic pain in right foot - Plan: DG Foot Complete Right, DG Ankle Complete Right  Trena PlattStephanie English, PA-C [facility] 12/30/20184:46 PM

## 2017-08-20 NOTE — Patient Instructions (Addendum)
Please ice the foot like an ice bath when this is hurting.  15 minutes.  I am giving you mobic.  Do not take this with any other anti-inflammatory like ibuprofen or naproxen with this medication. You also have some callous quality to the lateral area of your foot, which may mean that these shoes are a little too tight.  You may want to try a shoe that is fitting because you do have a narrow foot, but something with good shock absorbency in the heel.   If this does not help over the next 2 weeks, we can send you to podiatry.  Or we can do this today.    IF you received an x-ray today, you will receive an invoice from [facility]. Please contact [facility] at [phone] with questions or concerns regarding your invoice.   IF you received labwork today, you will receive an invoice from [facility]. Please contact [facility] at [phone] with questions or concerns regarding your invoice.   Our billing staff will not be able to assist you with questions regarding bills from these companies.  You will be contacted with the lab results as soon as they are available. The fastest way to get your results is to activate your My Chart account. Instructions are located on the last page of this paperwork. If you have not heard from us regarding the results in 2 weeks, please contact this office.

## 2017-08-21 ENCOUNTER — Telehealth: Payer: Self-pay

## 2017-08-21 NOTE — Telephone Encounter (Signed)
Copied from CRM [id number]. Topic: Referral - Request >> Aug 20, 2017  4:25 PM Terisa Starraylor, Brittany L wrote: Reason for CRM: Pt was seen today in the office by Trena PlattStephanie English, she put on her DC papers to see a podiatrist. She needs a referral sent somewhere and would like a CALL BACK after it is done. [phone]

## 2017-09-07 ENCOUNTER — Ambulatory Visit: Payer: 59 | Admitting: Podiatry

## 2017-09-07 ENCOUNTER — Ambulatory Visit: Payer: 59

## 2017-09-07 DIAGNOSIS — M779 Enthesopathy, unspecified: Principal | ICD-10-CM

## 2017-09-07 DIAGNOSIS — M216X1 Other acquired deformities of right foot: Secondary | ICD-10-CM

## 2017-09-07 DIAGNOSIS — M2141 Flat foot [pes planus] (acquired), right foot: Secondary | ICD-10-CM | POA: Diagnosis not present

## 2017-09-07 DIAGNOSIS — M216X2 Other acquired deformities of left foot: Secondary | ICD-10-CM

## 2017-09-07 DIAGNOSIS — M2142 Flat foot [pes planus] (acquired), left foot: Secondary | ICD-10-CM | POA: Diagnosis not present

## 2017-09-07 DIAGNOSIS — M778 Other enthesopathies, not elsewhere classified: Secondary | ICD-10-CM

## 2017-09-07 NOTE — Progress Notes (Signed)
   Subjective:    Patient ID: Amber Francis, female    DOB: [birth year], 27 y.o.   MRN: [id number]  HPI: She presents today chief complaint of pain to the right foot and ankle.  She states is really been bothering her own and off for the past year.  States that when it really does hurt the pain level is a 7 out of 10.  She states is a sharp shooting pain.  It bothers her most commonly when she is working.  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  She states that when she is active it bothers her to she is also tried a brace and she has had no injuries.  Nothing seems to make this any better.  She went to [facility]in urgent center and was given a brace.    Review of Systems  All other systems reviewed and are negative.      Objective:   Physical Exam: Vital signs are stable she is alert and oriented x3.  Pulses are strongly palpable.  Neurologic sensorium is intact.  Deep tendon reflexes are intact.  Muscle strength plus 5 out of 5 dorsiflexors plantar flexors inverters and everters all intrinsic musculature is intact.  Orthopedic evaluation demonstrates all joints distal to the ankle, full range of motion without crepitation.  Continues to evaluation demonstrates supple well-hydrated cutis no open lesions or wounds.  Radiographs reviewed today demonstrate a rectus mature foot with no major osseous abnormalities other than pes planus.  There appears that there is been injury at the talonavicular joint some point but he has gone on to heal there is a small fragment of bone present.  On ambulation is easy to tell that she has calcaneal eversion or valgus deformity with plantar flexion of the talus and moderate pronation.  She also has gastroc equinus on physical exam with the knee bent she can get well past neutral.  She has calf pain with the knee straight dorsiflexion of the foot.  She does demonstrate calcaneal eversion and some forefoot valgus.        Assessment & Plan:  Pes planus plantar  fasciitis gastroc equinus.  Plan: We will make her a pair of orthotics.

## 2017-10-05 ENCOUNTER — Ambulatory Visit: Payer: 59 | Admitting: Orthotics

## 2017-10-05 DIAGNOSIS — M2142 Flat foot [pes planus] (acquired), left foot: Secondary | ICD-10-CM

## 2017-10-05 DIAGNOSIS — M2141 Flat foot [pes planus] (acquired), right foot: Secondary | ICD-10-CM

## 2017-10-05 NOTE — Progress Notes (Signed)
Patient came in today to pick up custom made foot orthotics.  The goals were accomplished and the patient reported no dissatisfaction with said orthotics.  Patient was advised of breakin period and how to report any issues. 

## 2017-12-01 ENCOUNTER — Encounter: Payer: Self-pay | Admitting: Physician Assistant

## 2020-10-17 DIAGNOSIS — R634 Abnormal weight loss: Secondary | ICD-10-CM | POA: Diagnosis not present

## 2020-10-17 DIAGNOSIS — R6881 Early satiety: Secondary | ICD-10-CM | POA: Diagnosis not present

## 2020-10-17 DIAGNOSIS — K5909 Other constipation: Secondary | ICD-10-CM | POA: Diagnosis not present

## 2020-10-17 DIAGNOSIS — Z8719 Personal history of other diseases of the digestive system: Secondary | ICD-10-CM | POA: Diagnosis not present

## 2020-10-24 DIAGNOSIS — Z8616 Personal history of COVID-19: Secondary | ICD-10-CM | POA: Diagnosis not present

## 2020-10-24 DIAGNOSIS — R000 Tachycardia, unspecified: Secondary | ICD-10-CM | POA: Diagnosis not present

## 2020-10-24 DIAGNOSIS — R5381 Other malaise: Secondary | ICD-10-CM | POA: Diagnosis not present

## 2020-10-28 ENCOUNTER — Telehealth: Payer: Self-pay

## 2020-10-28 DIAGNOSIS — R002 Palpitations: Secondary | ICD-10-CM

## 2020-10-28 NOTE — Telephone Encounter (Signed)
Called patient to set up office visit with Dr. Eden Emms. Patient stated Dr. Eden Emms said she might need a echo before her visit. Will send message to Dr. Eden Emms, so order can be placed if needed.

## 2020-10-30 DIAGNOSIS — R140 Abdominal distension (gaseous): Secondary | ICD-10-CM | POA: Diagnosis not present

## 2020-10-30 DIAGNOSIS — R5383 Other fatigue: Secondary | ICD-10-CM | POA: Diagnosis not present

## 2020-10-30 NOTE — Telephone Encounter (Signed)
Can order echo for palpitations

## 2020-10-30 NOTE — Telephone Encounter (Signed)
Placed order for echo. Will have scheduling call patient for an appointment.

## 2020-11-02 NOTE — Progress Notes (Signed)
CARDIOLOGY CONSULT NOTE       Patient ID: Amber Francis MRN: [id number] DOB/AGE: 1991-06-18 30 y.o.  Admit date: (Not on file) Referring Physician: Tenny Craw Primary Physician: Duane Lope Primary Cardiologist: New Reason for Consultation: Palpitations  Active Problems:   * No active hospital problems. *   HPI:  30 y.o. referred by Dr Tenny Craw for palpitations. 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 Fatigue, dyspnea and palpitations. Rapid HR can occur at night No pre syncope Has older brother with no cardiac issues.   Family from [place]. Mom with her today Prior to COVID would walk up to 3 miles and now more fatigued Labs by primary ok including thyroid and Hct   ROS All other systems reviewed and negative except as noted above  Past Medical History:  Diagnosis Date  . Allergy   . COVID-19   . GERD (gastroesophageal reflux disease)   . Malaise and fatigue   . Tachycardia     Family History  Problem Relation Age of Onset  . Arthritis Mother   . Diabetes Other        Grandparents  . Diabetes Mother     Social History   Socioeconomic History  . Marital status: Single    Spouse name: Not on file  . Number of children: Not on file  . Years of education: Not on file  . Highest education level: Not on file  Occupational History  . Occupation: Full time student  Tobacco Use  . Smoking status: Never Smoker  . Smokeless tobacco: Never Used  Substance and Sexual Activity  . Alcohol use: No    Alcohol/week: 0.0 standard drinks  . Drug use: No  . Sexual activity: Never  Other Topics Concern  . Not on file  Social History Narrative   Single, she is a Consulting civil engineer at [employer]. No children. No caffeine.   12/27/2014   Social Determinants of Health   Financial Resource  Strain: Not on file  Food Insecurity: Not on file  Transportation Needs: Not on file  Physical Activity: Not on file  Stress: Not on file  Social Connections: Not on file  Intimate Partner Violence: Not on file    History reviewed. No pertinent surgical history.    Current Outpatient Medications:  .  Acetylcysteine (NAC 600 PO), Take by mouth., Disp: , Rfl:  .  Bioflavonoid Products (QUERCETIN COMPLEX IMMUNE PO), Take 400 mg by mouth daily., Disp: , Rfl:  .  cetirizine (ZYRTEC) 10 MG tablet, Take 10 mg by mouth as needed. , Disp: , Rfl:  .  Cholecalciferol 1.25 MG (50000 UT) TABS, Take by mouth., Disp: , Rfl:  .  Maca Root (FEMMENESSENCE MACAHARMONY PO), Take 2 capsules by mouth in the morning and at bedtime., Disp: , Rfl:  .  Multiple Vitamins-Minerals (MULTI FOR HER PO), Take by mouth., Disp: , Rfl:  .  Nutritional Supplements (WOMENS HEALTH SUPPORT) THERAPY PACK MISC, Take by mouth., Disp: , Rfl:  .  Omega-3 Fatty Acids (FISH OIL) 1000 MG CAPS, Take by mouth., Disp: , Rfl:  .  Probiotic Product (PROBIOTIC DAILY PO), Take by mouth daily. , Disp: , Rfl:  .  Selenium 100 MCG CAPS, Take by mouth., Disp: , Rfl:  .  vitamin C (  ASCORBIC ACID) 500 MG tablet, Take 500 mg by mouth daily., Disp: , Rfl:     Physical Exam: Blood pressure 100/72, pulse 73, height 5\' 2"  (1.575 m), weight 46.7 kg, SpO2 98 %.    Affect appropriate Healthy:  appears stated age HEENT: normal Neck supple with no adenopathy JVP normal no bruits no thyromegaly Lungs clear with no wheezing and good diaphragmatic motion Heart:  S1/S2 no murmur, no rub, gallop or click PMI normal Abdomen: benighn, BS positve, no tenderness, no AAA no bruit.  No HSM or HJR Distal pulses intact with no bruits No edema Neuro non-focal Skin warm and dry No muscular weakness   Labs:   Lab Results  Component Value Date   WBC 8.1 12/04/2014   HGB 13.0 12/04/2014   HCT 39.7 12/04/2014   MCV 89.8 12/04/2014   No results for  input(s): NA, K, CL, CO2, BUN, CREATININE, CALCIUM, PROT, BILITOT, ALKPHOS, ALT, AST, GLUCOSE in the last 168 hours.  Invalid input(s): LABALBU No results found for: CKTOTAL, CKMB, CKMBINDEX, TROPONINI No results found for: CHOL No results found for: HDL No results found for: LDLCALC No results found for: TRIG No results found for: CHOLHDL No results found for: LDLDIRECT    Radiology: No results found.  EKG: SR rate 73 normal    ASSESSMENT AND PLAN:  1. Palpitations:  Benign offered beta blocker Rx PRN patient deferred  2. Dyspnea:  With fatigue post COVID check echo make sure LV/RV funciton  Normal   F/U PRN if echo normal    Signed: 02/03/2015 11/14/2020, 8:49 AM

## 2020-11-14 ENCOUNTER — Ambulatory Visit: Payer: 59 | Admitting: Cardiovascular Disease

## 2020-11-14 ENCOUNTER — Other Ambulatory Visit: Payer: Self-pay

## 2020-11-14 ENCOUNTER — Encounter: Payer: Self-pay | Admitting: Cardiovascular Disease

## 2020-11-14 VITALS — BP 100/72 | HR 73 | Ht 62.0 in | Wt 103.0 lb

## 2020-11-14 DIAGNOSIS — R002 Palpitations: Secondary | ICD-10-CM | POA: Diagnosis not present

## 2020-11-14 DIAGNOSIS — R0600 Dyspnea, unspecified: Secondary | ICD-10-CM

## 2020-11-14 DIAGNOSIS — R0609 Other forms of dyspnea: Secondary | ICD-10-CM

## 2020-11-14 NOTE — Patient Instructions (Signed)

## 2020-11-27 ENCOUNTER — Other Ambulatory Visit: Payer: Self-pay

## 2020-11-27 ENCOUNTER — Ambulatory Visit (HOSPITAL_COMMUNITY): Payer: 59 | Attending: Cardiovascular Disease

## 2020-11-27 DIAGNOSIS — R002 Palpitations: Secondary | ICD-10-CM | POA: Insufficient documentation

## 2020-11-27 LAB — ECHOCARDIOGRAM COMPLETE
Area-P 1/2: 3.85 cm2
S' Lateral: 2.5 cm

## 2020-12-27 DIAGNOSIS — Z20828 Contact with and (suspected) exposure to other viral communicable diseases: Secondary | ICD-10-CM | POA: Diagnosis not present

## 2020-12-27 DIAGNOSIS — Z20822 Contact with and (suspected) exposure to covid-19: Secondary | ICD-10-CM | POA: Diagnosis not present

## 2021-01-03 DIAGNOSIS — K2950 Unspecified chronic gastritis without bleeding: Secondary | ICD-10-CM | POA: Diagnosis not present

## 2021-01-03 DIAGNOSIS — K2960 Other gastritis without bleeding: Secondary | ICD-10-CM | POA: Diagnosis not present

## 2021-01-03 DIAGNOSIS — R634 Abnormal weight loss: Secondary | ICD-10-CM | POA: Diagnosis not present

## 2021-01-03 DIAGNOSIS — B9681 Helicobacter pylori [H. pylori] as the cause of diseases classified elsewhere: Secondary | ICD-10-CM | POA: Diagnosis not present

## 2021-01-03 DIAGNOSIS — K219 Gastro-esophageal reflux disease without esophagitis: Secondary | ICD-10-CM | POA: Diagnosis not present

## 2021-01-03 DIAGNOSIS — K2970 Gastritis, unspecified, without bleeding: Secondary | ICD-10-CM | POA: Diagnosis not present

## 2021-01-03 DIAGNOSIS — R6881 Early satiety: Secondary | ICD-10-CM | POA: Diagnosis not present

## 2021-03-25 ENCOUNTER — Other Ambulatory Visit: Payer: Self-pay

## 2021-03-25 ENCOUNTER — Ambulatory Visit: Payer: 59

## 2021-03-25 ENCOUNTER — Encounter: Payer: Self-pay | Admitting: Podiatry

## 2021-03-25 ENCOUNTER — Ambulatory Visit: Payer: 59 | Admitting: Podiatry

## 2021-03-25 DIAGNOSIS — M2142 Flat foot [pes planus] (acquired), left foot: Secondary | ICD-10-CM

## 2021-03-25 DIAGNOSIS — M2141 Flat foot [pes planus] (acquired), right foot: Secondary | ICD-10-CM

## 2021-03-25 DIAGNOSIS — M722 Plantar fascial fibromatosis: Secondary | ICD-10-CM | POA: Diagnosis not present

## 2021-03-25 NOTE — Patient Instructions (Signed)

## 2021-03-28 NOTE — Progress Notes (Signed)
Subjective:   Patient ID: Amber Francis, female   DOB: 30 y.o.   MRN: [id number]   HPI 30 year old female presents the office today requesting new orthotics.  Her inserts that she has about 28 years old and she wears them every day and they are starting to get worn out.  She states that when she does get discomfort mostly to the arch of the foot.  She has not had any swelling.  No recent injury.   Review of Systems  All other systems reviewed and are negative.  Past Medical History:  Diagnosis Date   Allergy    COVID-19    GERD (gastroesophageal reflux disease)    Malaise and fatigue    Tachycardia     History reviewed. No pertinent surgical history.   Current Outpatient Medications:    lansoprazole (PREVACID) 30 MG capsule, Take by mouth., Disp: , Rfl:    MAGNESIUM PO, Take by mouth., Disp: , Rfl:    VITAMIN K PO, Take by mouth., Disp: , Rfl:    Bioflavonoid Products (QUERCETIN COMPLEX IMMUNE PO), Take 400 mg by mouth daily., Disp: , Rfl:    cetirizine (ZYRTEC) 10 MG tablet, Take 10 mg by mouth as needed. , Disp: , Rfl:    Cholecalciferol 1.25 MG (50000 UT) TABS, Take by mouth., Disp: , Rfl:    Multiple Vitamins-Minerals (MULTI FOR HER PO), Take by mouth., Disp: , Rfl:    Omega-3 Fatty Acids (FISH OIL) 1000 MG CAPS, Take by mouth., Disp: , Rfl:    Probiotic Product (PROBIOTIC DAILY PO), Take by mouth daily. , Disp: , Rfl:    Selenium 100 MCG CAPS, Take by mouth., Disp: , Rfl:    vitamin C (ASCORBIC ACID) 500 MG tablet, Take 500 mg by mouth daily., Disp: , Rfl:   No Known Allergies        Objective:  Physical Exam  General: AAO x3, NAD  Dermatological: Skin is warm, dry and supple bilateral.  There are no open sores, no preulcerative lesions, no rash or signs of infection present.  Vascular: Dorsalis Pedis artery and Posterior Tibial artery pedal pulses are 2/4 bilateral with immedate capillary fill time.There is no pain with calf compression, swelling, warmth, erythema.    Neruologic: Grossly intact via light touch bilateral.   Musculoskeletal: Not able to elicit any area of pinpoint tenderness.  There is no edema, erythema.  Symptoms when she does get discomfort on medial band plantar fashion the arch of the foot.  No significant discomfort on exam today.  Muscular strength 5/5 in all groups tested bilateral.  Equinus present, mild decreased medial arch height.  Gait: Unassisted, Nonantalgic.       Assessment:   30 year old female with plantar fasciitis, requesting new orthotics     Plan:  -Treatment options discussed including all alternatives, risks, and complications -Etiology of symptoms were discussed -She was measured for updated orthotics today.  We discussed general stretching for signs to be performed as well.  We also discussed shoes.  Return in about 4 weeks (around 04/22/2021) for pick up orthotics .  Vivi Barrack DPM

## 2021-04-10 ENCOUNTER — Telehealth: Payer: Self-pay | Admitting: Podiatry

## 2021-04-10 NOTE — Telephone Encounter (Signed)
Orthotics in... called pt and it would not let me leave a message but I did send a my chart message for pt to call to discuss.

## 2021-05-07 DIAGNOSIS — N943 Premenstrual tension syndrome: Secondary | ICD-10-CM | POA: Diagnosis not present

## 2021-05-07 DIAGNOSIS — E559 Vitamin D deficiency, unspecified: Secondary | ICD-10-CM | POA: Diagnosis not present

## 2021-05-07 DIAGNOSIS — N914 Secondary oligomenorrhea: Secondary | ICD-10-CM | POA: Diagnosis not present

## 2021-09-10 ENCOUNTER — Other Ambulatory Visit: Payer: Self-pay | Admitting: Obstetrics and Gynecology

## 2021-09-10 DIAGNOSIS — N914 Secondary oligomenorrhea: Secondary | ICD-10-CM

## 2021-09-15 DIAGNOSIS — Z8481 Family history of carrier of genetic disease: Secondary | ICD-10-CM | POA: Diagnosis not present

## 2021-09-15 DIAGNOSIS — N914 Secondary oligomenorrhea: Secondary | ICD-10-CM | POA: Diagnosis not present

## 2021-09-15 DIAGNOSIS — R5383 Other fatigue: Secondary | ICD-10-CM | POA: Diagnosis not present

## 2021-09-15 DIAGNOSIS — Z0183 Encounter for blood typing: Secondary | ICD-10-CM | POA: Diagnosis not present

## 2021-10-01 ENCOUNTER — Ambulatory Visit
Admission: RE | Admit: 2021-10-01 | Discharge: 2021-10-01 | Disposition: A | Discharge status: Home / Self Care | Payer: 59 | Source: Ambulatory Visit | Attending: Obstetrics and Gynecology | Admitting: Obstetrics and Gynecology

## 2021-10-01 DIAGNOSIS — N914 Secondary oligomenorrhea: Secondary | ICD-10-CM

## 2021-10-01 DIAGNOSIS — N915 Oligomenorrhea, unspecified: Secondary | ICD-10-CM | POA: Diagnosis not present

## 2022-09-05 IMAGING — US US PELVIS COMPLETE
1 series · 14 of 25 positions shown · non-contrast
Comparison: None.

CLINICAL DATA: Initial evaluation for secondary oligomenorrhea.

EXAM:
TRANSABDOMINAL ULTRASOUND OF PELVIS
TECHNIQUE: Transabdominal ultrasound examination of the pelvis was performed
including evaluation of the uterus, ovaries, adnexal regions, and
pelvic cul-de-sac.

[Series 1: us pelvis complete · 0.25mm/px · 14 of 42 slices shown]
[im 1/42]
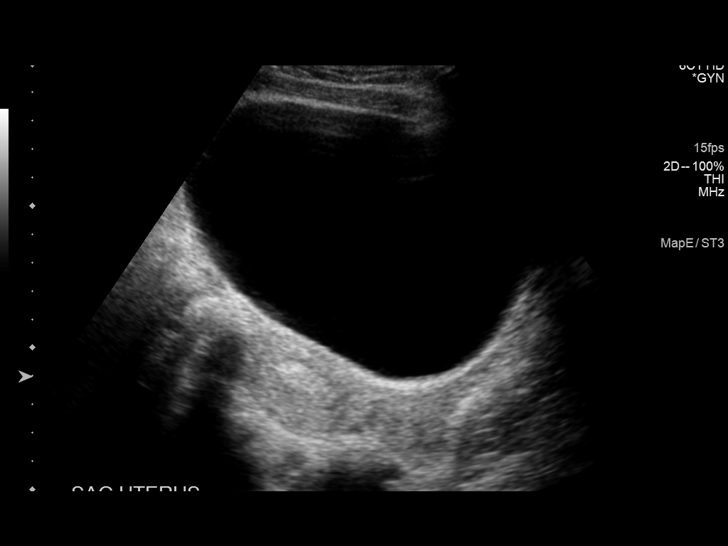
[im 4/42]
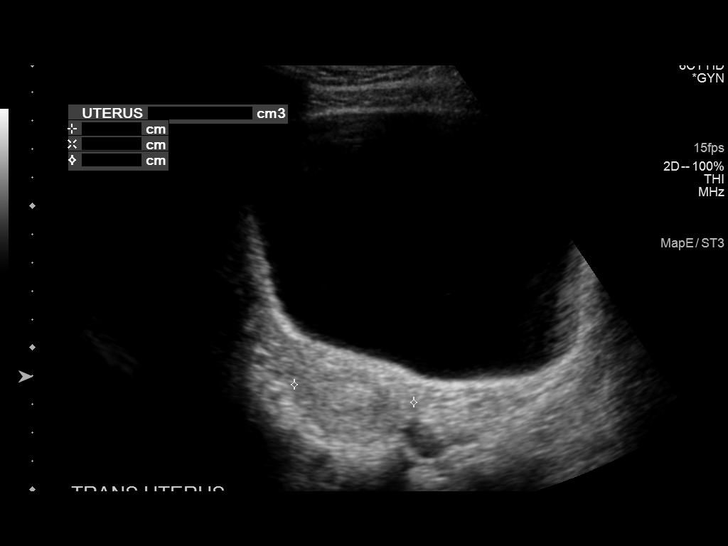
[im 7/42]
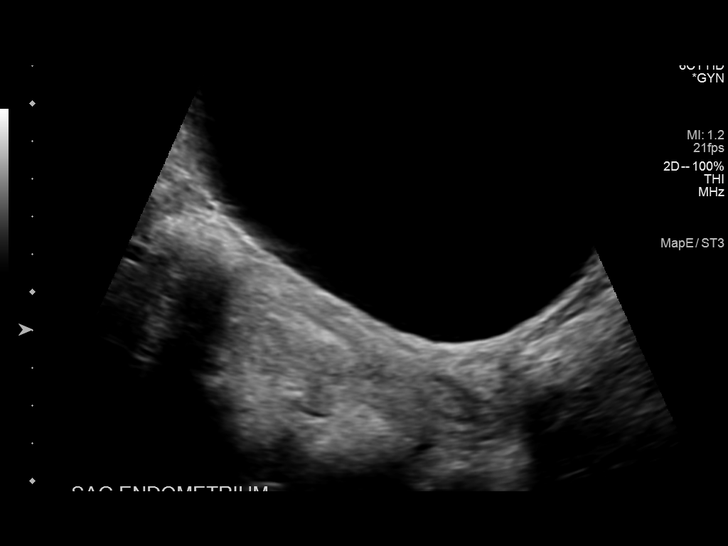
[im 11/42]
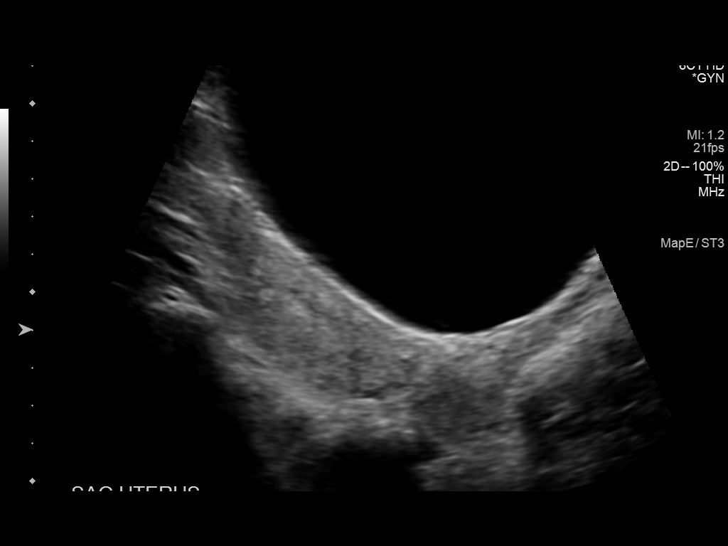
[im 14/42]
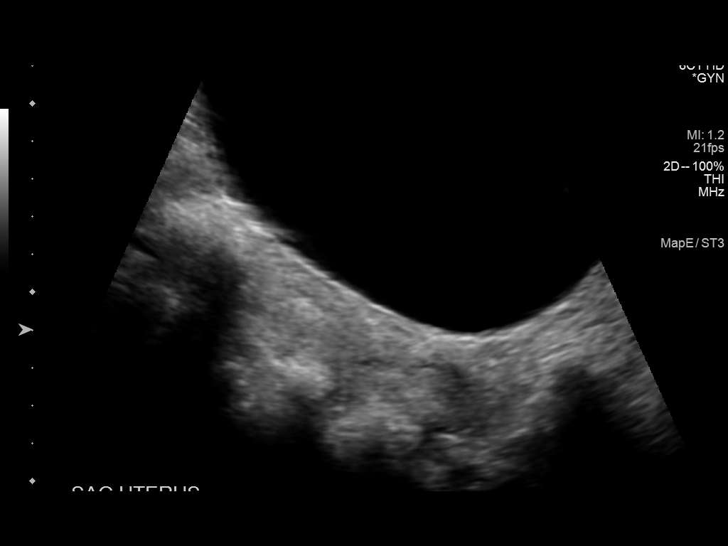
[im 16/42]
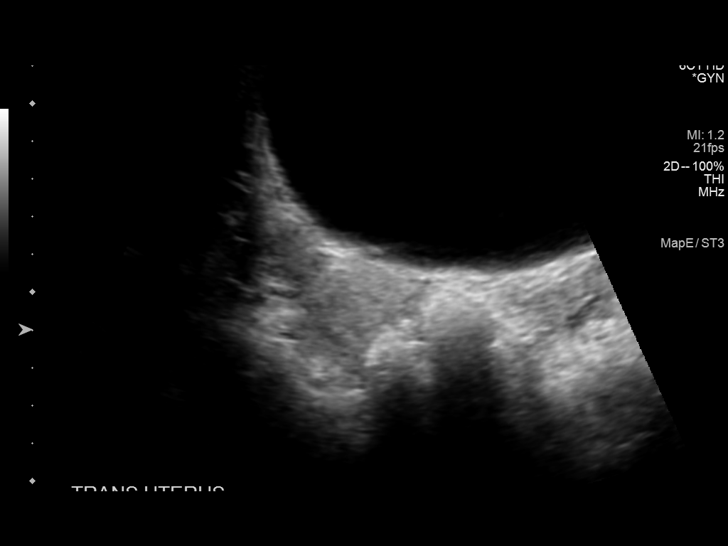
[im 19/42]
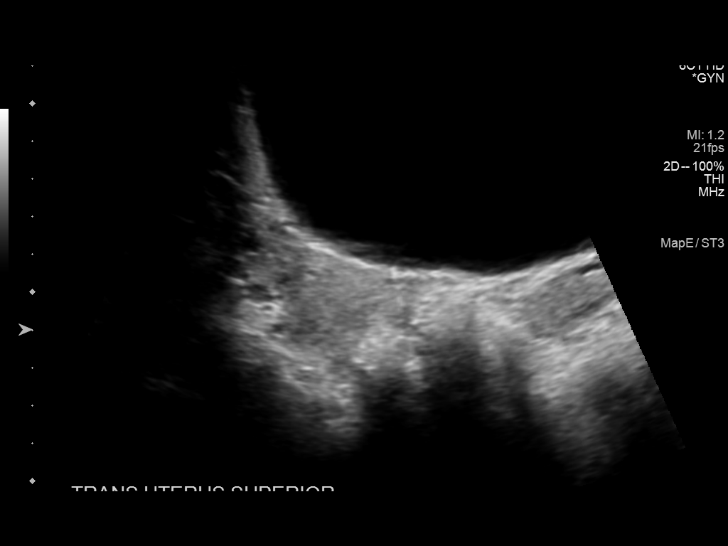
[im 23/42]
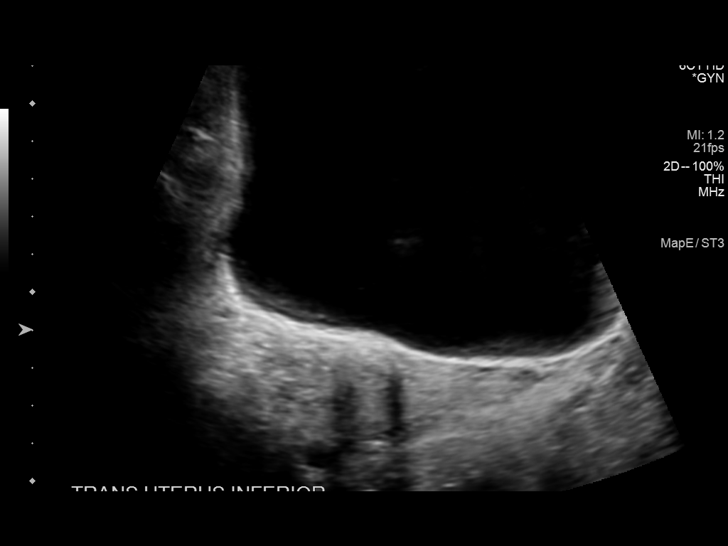
[im 26/42]
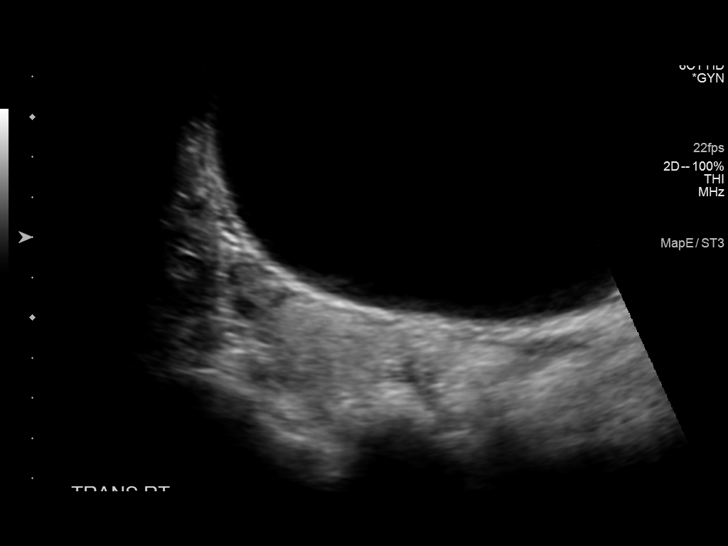
[im 28/42]
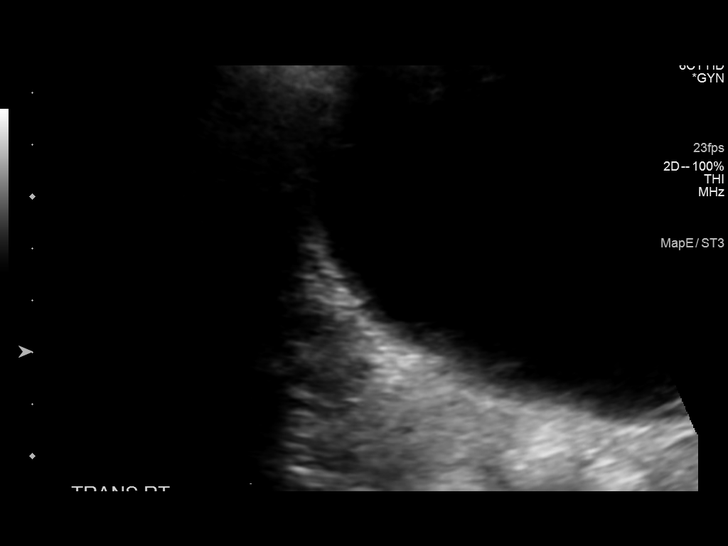
[im 31/42]
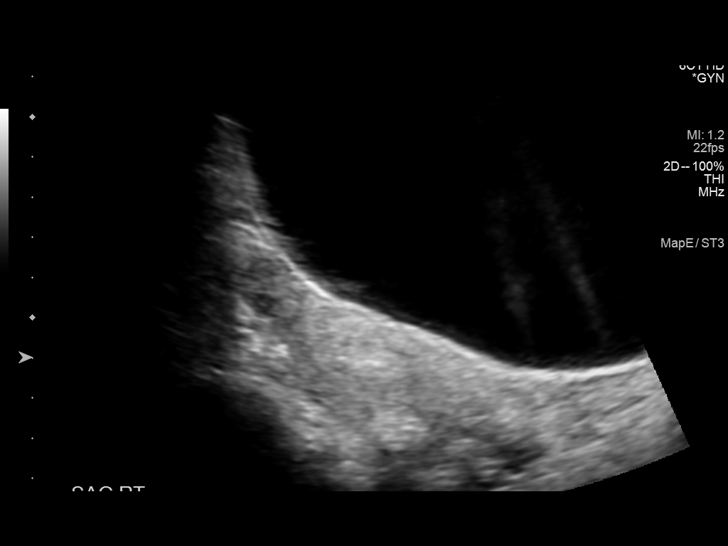
[im 35/42]
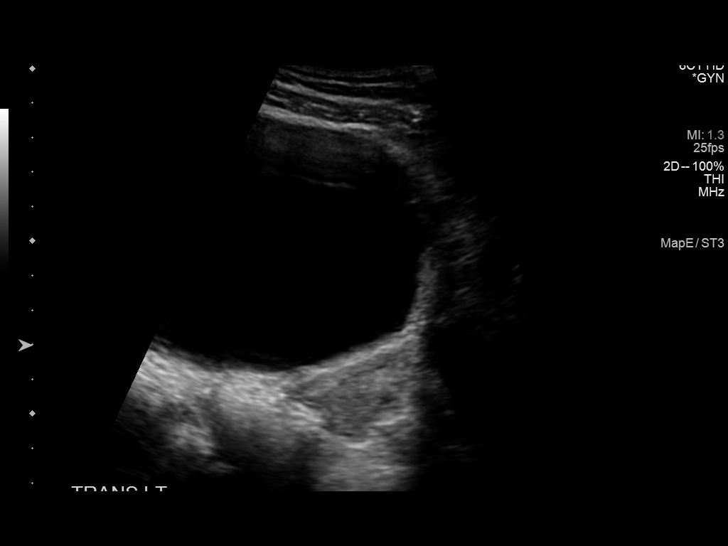
[im 38/42]
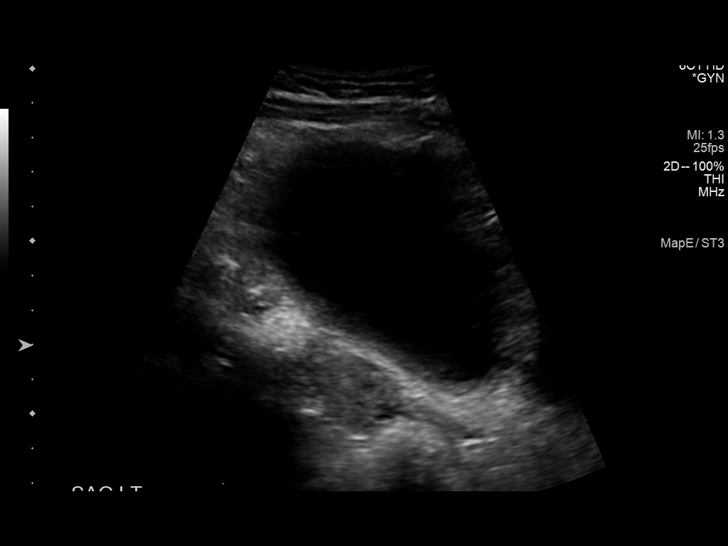
[im 42/42]
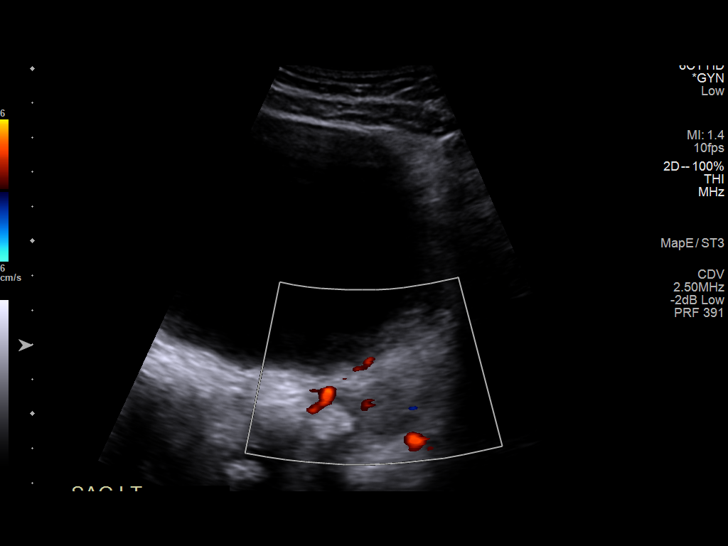

[14 of 25 positions shown; findings below may reference images not displayed]

FINDINGS: Uterus

Measurements: 7.1 x 3.0 x 4.3 cm = volume: 48.4 mL. No fibroids or
other mass visualized.

Endometrium

Thickness: 10 mm.  No focal abnormality visualized.

Right ovary

Measurements: 1.9 x 1.5 x 1.2 cm = volume: 1.8 mL. Normal
appearance/no adnexal mass.

Left ovary

Measurements: 2.8 x 1.9 x 1.9 cm = volume: 5.4 mL. Normal
appearance/no adnexal mass.

Other findings:  No abnormal free fluid.
IMPRESSION: Normal pelvic ultrasound.

## 2023-02-02 DIAGNOSIS — R5383 Other fatigue: Secondary | ICD-10-CM | POA: Diagnosis not present

## 2023-02-02 DIAGNOSIS — N914 Secondary oligomenorrhea: Secondary | ICD-10-CM | POA: Diagnosis not present

## 2023-02-02 DIAGNOSIS — F419 Anxiety disorder, unspecified: Secondary | ICD-10-CM | POA: Diagnosis not present

## 2023-02-02 DIAGNOSIS — R109 Unspecified abdominal pain: Secondary | ICD-10-CM | POA: Diagnosis not present

## 2023-02-02 DIAGNOSIS — Z8481 Family history of carrier of genetic disease: Secondary | ICD-10-CM | POA: Diagnosis not present

## 2023-02-02 DIAGNOSIS — Z0183 Encounter for blood typing: Secondary | ICD-10-CM | POA: Diagnosis not present

## 2023-02-02 DIAGNOSIS — N943 Premenstrual tension syndrome: Secondary | ICD-10-CM | POA: Diagnosis not present

## 2023-02-02 DIAGNOSIS — R140 Abdominal distension (gaseous): Secondary | ICD-10-CM | POA: Diagnosis not present

## 2023-02-02 DIAGNOSIS — K5900 Constipation, unspecified: Secondary | ICD-10-CM | POA: Diagnosis not present

## 2023-02-02 DIAGNOSIS — R42 Dizziness and giddiness: Secondary | ICD-10-CM | POA: Diagnosis not present

## 2024-03-29 DIAGNOSIS — R509 Fever, unspecified: Secondary | ICD-10-CM | POA: Diagnosis not present

## 2024-03-29 DIAGNOSIS — H6121 Impacted cerumen, right ear: Secondary | ICD-10-CM | POA: Diagnosis not present

## 2024-03-29 DIAGNOSIS — J069 Acute upper respiratory infection, unspecified: Secondary | ICD-10-CM | POA: Diagnosis not present

## 2024-04-03 ENCOUNTER — Other Ambulatory Visit (HOSPITAL_COMMUNITY): Payer: Self-pay

## 2024-04-03 DIAGNOSIS — R5383 Other fatigue: Secondary | ICD-10-CM | POA: Diagnosis not present

## 2024-04-03 DIAGNOSIS — R110 Nausea: Secondary | ICD-10-CM | POA: Diagnosis not present

## 2024-04-03 DIAGNOSIS — R5381 Other malaise: Secondary | ICD-10-CM | POA: Diagnosis not present

## 2024-04-03 DIAGNOSIS — B2790 Infectious mononucleosis, unspecified without complication: Secondary | ICD-10-CM | POA: Diagnosis not present

## 2024-04-03 DIAGNOSIS — R509 Fever, unspecified: Secondary | ICD-10-CM | POA: Diagnosis not present

## 2024-04-03 MED ORDER — ONDANSETRON 4 MG PO TBDP
4.0000 mg | ORAL_TABLET | Freq: Three times a day (TID) | ORAL | 0 refills | Status: AC | PRN
  Filled 2024-04-03: qty 15, 5d supply, fill #0

## 2024-04-12 DIAGNOSIS — Z8481 Family history of carrier of genetic disease: Secondary | ICD-10-CM | POA: Diagnosis not present

## 2024-04-12 DIAGNOSIS — N943 Premenstrual tension syndrome: Secondary | ICD-10-CM | POA: Diagnosis not present

## 2024-04-12 DIAGNOSIS — N914 Secondary oligomenorrhea: Secondary | ICD-10-CM | POA: Diagnosis not present

## 2024-04-12 DIAGNOSIS — R42 Dizziness and giddiness: Secondary | ICD-10-CM | POA: Diagnosis not present

## 2024-04-12 DIAGNOSIS — N942 Vaginismus: Secondary | ICD-10-CM | POA: Diagnosis not present

## 2024-04-12 DIAGNOSIS — K5900 Constipation, unspecified: Secondary | ICD-10-CM | POA: Diagnosis not present

## 2024-04-12 DIAGNOSIS — R109 Unspecified abdominal pain: Secondary | ICD-10-CM | POA: Diagnosis not present

## 2024-04-12 DIAGNOSIS — F419 Anxiety disorder, unspecified: Secondary | ICD-10-CM | POA: Diagnosis not present

## 2024-04-12 DIAGNOSIS — Z01411 Encounter for gynecological examination (general) (routine) with abnormal findings: Secondary | ICD-10-CM | POA: Diagnosis not present

## 2024-04-12 DIAGNOSIS — R140 Abdominal distension (gaseous): Secondary | ICD-10-CM | POA: Diagnosis not present

## 2024-08-11 ENCOUNTER — Encounter: Payer: Self-pay | Admitting: Podiatry

## 2024-08-11 ENCOUNTER — Ambulatory Visit: Admitting: Podiatry

## 2024-08-11 DIAGNOSIS — Q666 Other congenital valgus deformities of feet: Secondary | ICD-10-CM

## 2024-08-11 NOTE — Patient Instructions (Signed)

## 2024-08-11 NOTE — Progress Notes (Signed)
"  °  Subjective:  Patient ID: Amber Francis, female    DOB: 08/12/1991,  MRN: [id number]  Chief Complaint  Patient presents with   Plantar Fasciitis    Foot exam,discuss orthotics. 0 pain. Non diabetic. Wearing custom orthotics.     Discussed the use of AI scribe software for clinical note transcription with the patient, who gave verbal consent to proceed.  History of Present Illness Amber Francis is a 33 year old female with pes planus who presents for evaluation of persistent arch discomfort.  She has had bilateral arch discomfort since August 2022, worse after work but not severe.   She has used orthotics since August 2022 and now wears them only at work. She is unsure if the pain started with orthotic use or has changed as she has worn.  She has no ankle or dorsal foot pain. She notes calf tightness, which she attributes to her flat feet.      Objective:  There were no vitals filed for this visit.  Physical Exam General: AAO x3, NAD  Dermatological: Skin is warm, dry and supple bilateral. There are no open sores, no preulcerative lesions, no rash or signs of infection present.  Vascular: Dorsalis Pedis artery and Posterior Tibial artery pedal pulses are 2/4 bilateral with immedate capillary fill time. There is no pain with calf compression, swelling, warmth, erythema.   Neruologic: Grossly intact via light touch bilateral.   Musculoskeletal: There is decreased medial arch upon weightbearing.  Ankle, subtalar joint range of motion intact but any restrictions.  There is no area of pinpoint tenderness.  MMT 5/5.     No images are attached to the encounter.    Results     Assessment:   1. Pes planovalgus      Plan:  Patient was evaluated and treated and all questions answered.  Assessment and Plan Assessment & Plan Pes planus Chronic pes planus with mild arch discomfort post-activity. Current orthotics insufficient. Calf muscle tightness secondary to pes  planus. - Initiated new custom orthotics with increased arch support and arranged measurement. - Discussed stretching exercises calf and Achilles stretching.    Return for orthotic pick-up.   Donnice JONELLE Fees DPM  "

## 2024-09-08 ENCOUNTER — Telehealth: Payer: Self-pay | Admitting: Podiatry

## 2024-09-08 NOTE — Telephone Encounter (Signed)
 error

## 2024-09-15 ENCOUNTER — Ambulatory Visit: Admitting: Podiatrist

## 2024-09-15 DIAGNOSIS — Q666 Other congenital valgus deformities of feet: Secondary | ICD-10-CM

## 2024-09-15 NOTE — Progress Notes (Signed)
